# Patient Record
Sex: Female | Born: 2005 | Race: Black or African American | Hispanic: No | Marital: Single | Smoking: Never smoker
Health system: Southern US, Community
[De-identification: ages and names within clinical notes are randomized; demographics above are authoritative.]

## PROBLEM LIST (undated history)

## (undated) HISTORY — PX: MYRINGOTOMY: SUR874

---
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
## 2024-03-27 ENCOUNTER — Emergency Department (HOSPITAL_BASED_OUTPATIENT_CLINIC_OR_DEPARTMENT_OTHER)
Admission: EM | Admit: 2024-03-27 | Discharge: 2024-03-27 | Disposition: A | Discharge status: Home / Self Care | Attending: Emergency Medicine | Admitting: Emergency Medicine

## 2024-03-27 ENCOUNTER — Encounter (HOSPITAL_BASED_OUTPATIENT_CLINIC_OR_DEPARTMENT_OTHER): Payer: Self-pay | Admitting: Emergency Medicine

## 2024-03-27 ENCOUNTER — Emergency Department (HOSPITAL_BASED_OUTPATIENT_CLINIC_OR_DEPARTMENT_OTHER): Admitting: Radiology

## 2024-03-27 ENCOUNTER — Other Ambulatory Visit: Payer: Self-pay

## 2024-03-27 DIAGNOSIS — M545 Low back pain, unspecified: Secondary | ICD-10-CM | POA: Diagnosis present

## 2024-03-27 LAB — PREGNANCY, URINE: Preg Test, Ur: NEGATIVE

## 2024-03-27 MED ORDER — NAPROXEN 250 MG PO TABS
500.0000 mg | ORAL_TABLET | Freq: Once | ORAL | Status: AC
  Administered 2024-03-27: 500 mg via ORAL
  Filled 2024-03-27: qty 2

## 2024-03-27 MED ORDER — LIDOCAINE 5 % EX PTCH: 1.0000 | MEDICATED_PATCH | CUTANEOUS | 0 refills | Status: AC

## 2024-03-27 MED ORDER — ACETAMINOPHEN 500 MG PO TABS
1000.0000 mg | ORAL_TABLET | Freq: Once | ORAL | Status: AC
  Administered 2024-03-27: 1000 mg via ORAL
  Filled 2024-03-27: qty 2

## 2024-03-27 MED ORDER — LIDOCAINE 5 % EX PTCH
2.0000 | MEDICATED_PATCH | CUTANEOUS | Status: DC
  Administered 2024-03-27: 2 via TRANSDERMAL
  Filled 2024-03-27: qty 2

## 2024-03-27 NOTE — Discharge Instructions (Addendum)
 You can alternate tylenol  and ibuprofen 

## 2024-03-27 NOTE — ED Triage Notes (Signed)
  Patient comes in with lower back pain that has been going on for 2 days.  Denies any radiation to legs.  No OTC meds.  Denies any injury.  Pain 9/10, aching.  Denies any urinary symptoms.

## 2024-03-27 NOTE — ED Notes (Signed)
 Patient transported to X-ray

## 2024-03-27 NOTE — ED Provider Notes (Signed)
 [place] EMERGENCY DEPARTMENT AT [facility] Provider Note   CSN: [id number] Arrival date & time: 03/27/24  9541     Patient presents with: Back Pain   Shannon Vazquez is a 18 y.o. female.   The history is provided by the patient.  Back Pain Location:  Sacro-iliac joint Quality:  Aching Radiates to:  Does not radiate Pain severity:  Moderate Pain is:  Same all the time Onset quality:  Gradual Duration:  2 days Timing:  Constant Progression:  Unchanged Chronicity:  New Context: not lifting heavy objects, not MVA, not physical stress, not recent illness, not recent injury and not twisting   Relieved by:  Nothing Worsened by:  Nothing Ineffective treatments:  None tried Associated symptoms: no abdominal pain, no bladder incontinence, no bowel incontinence, no dysuria, no fever, no paresthesias and no weakness   Risk factors: not pregnant and no recent surgery        Prior to Admission medications   Not on File    Allergies: Patient has no known allergies.    Review of Systems  Constitutional:  Negative for fever.  Gastrointestinal:  Negative for abdominal pain and bowel incontinence.  Genitourinary:  Negative for bladder incontinence and dysuria.  Musculoskeletal:  Positive for back pain.  Neurological:  Negative for weakness and paresthesias.  All other systems reviewed and are negative.   Updated Vital Signs BP 126/85 (BP Location: Right Arm)   Pulse 98   Temp 98.5 F (36.9 C) (Oral)   Resp 20   Ht 5' 2 (1.575 m)   Wt 86.2 kg   LMP 03/13/2024 (Approximate)   SpO2 100%   BMI 34.75 kg/m   Physical Exam Vitals and nursing note reviewed. Exam conducted with a chaperone present.  Constitutional:      General: She is not in acute distress.    Appearance: She is well-developed.  HENT:     Head: Normocephalic and atraumatic.     Nose: Nose normal.  Eyes:     Extraocular Movements: Extraocular movements intact.  Cardiovascular:     Rate and  Rhythm: Normal rate and regular rhythm.     Pulses: Normal pulses.     Heart sounds: Normal heart sounds.  Pulmonary:     Effort: Pulmonary effort is normal. No respiratory distress.     Breath sounds: Normal breath sounds.  Abdominal:     General: Bowel sounds are normal. There is no distension.     Palpations: Abdomen is soft.     Tenderness: There is no abdominal tenderness. There is no guarding or rebound.  Musculoskeletal:        General: Normal range of motion.     Cervical back: Normal and neck supple.     Thoracic back: Normal.     Lumbar back: Normal.  Skin:    General: Skin is dry.     Capillary Refill: Capillary refill takes less than 2 seconds.     Findings: No erythema or rash.  Neurological:     General: No focal deficit present.     Deep Tendon Reflexes: Reflexes normal.  Psychiatric:        Mood and Affect: Mood normal.     (all labs ordered are listed, but only abnormal results are displayed) Labs Reviewed  PREGNANCY, URINE    EKG: None  Radiology: No results found.   Procedures   Medications Ordered in the ED  lidocaine  (LIDODERM ) 5 % 2 patch (has no administration in time range)  acetaminophen  (TYLENOL ) tablet 1,000 mg (has no administration in time range)  naproxen  (NAPROSYN ) tablet 500 mg (has no administration in time range)                                    Medical Decision Making Amount and/or Complexity of Data Reviewed External Data Reviewed: notes.    Details: Previous notes reviewed Labs: ordered.    Details: Pregnancy is negative  Radiology: ordered and independent interpretation performed.    Details: Negative XR  Risk OTC drugs. Prescription drug management. Risk Details: No red flags, no weakness.  No urinary symptoms no midline tenderness.  NSAIDs, tylenol  and lidoderm .  Stable for discharge.  Follow up with your PMD, strict returns      Final diagnoses:  None   No signs of systemic illness or infection. The  patient is nontoxic-appearing on exam and vital signs are within normal limits.  I have reviewed the triage vital signs and the nursing notes. Pertinent labs & imaging results that were available during my care of the patient were reviewed by me and considered in my medical decision making (see chart for details). After history, exam, and medical workup I feel the patient has been appropriately medically screened and is safe for discharge home. Pertinent diagnoses were discussed with the patient. Patient was given return precautions.    ED Discharge Orders     None          Eber Ferrufino, MD 03/27/24 [id number]

## 2024-04-01 ENCOUNTER — Encounter: Payer: Self-pay | Admitting: *Deleted

## 2024-04-03 ENCOUNTER — Ambulatory Visit (INDEPENDENT_AMBULATORY_CARE_PROVIDER_SITE_OTHER)

## 2024-04-03 DIAGNOSIS — Z3200 Encounter for pregnancy test, result unknown: Secondary | ICD-10-CM | POA: Diagnosis not present

## 2024-04-03 LAB — POCT URINE PREGNANCY: Preg Test, Ur: NEGATIVE

## 2024-04-03 NOTE — Progress Notes (Signed)
 Shannon Vazquez here for a UPT. Pt had a positive upt at home. LMP is 03/13/24.     UPT in office Negative.    Informed pt since she has not missed period, and our test is showing negative to wait and retest at home to see what her result is. If positive, call us  to set up an ultrasound. If negative, she may also call and let us  know.
